# Patient Record
Sex: Female | Born: 1960 | Race: Black or African American | Hispanic: No | Marital: Single | Smoking: Never smoker
Health system: Southern US, Community
[De-identification: ages and names within clinical notes are randomized; demographics above are authoritative.]

## PROBLEM LIST (undated history)

## (undated) HISTORY — PX: COLONOSCOPY: SHX174

## (undated) HISTORY — PX: TONSILLECTOMY AND ADENOIDECTOMY: SUR1326

---

## 2013-06-08 HISTORY — PX: HEMORRHOID BANDING: SHX5850

## 2013-11-07 ENCOUNTER — Ambulatory Visit (INDEPENDENT_AMBULATORY_CARE_PROVIDER_SITE_OTHER): Payer: BC Managed Care – PPO | Admitting: Family Medicine

## 2013-11-07 ENCOUNTER — Encounter: Payer: Self-pay | Admitting: Family Medicine

## 2013-11-07 VITALS — BP 100/70 | HR 60 | Wt 160.0 lb

## 2013-11-07 DIAGNOSIS — K625 Hemorrhage of anus and rectum: Secondary | ICD-10-CM

## 2013-11-07 DIAGNOSIS — Z1239 Encounter for other screening for malignant neoplasm of breast: Secondary | ICD-10-CM

## 2013-11-07 LAB — COMPREHENSIVE METABOLIC PANEL
ALBUMIN: 3.9 g/dL (ref 3.5–5.2)
ALT: <8 U/L (ref 0–35)
AST: 14 U/L (ref 0–37)
Alkaline Phosphatase: 71 U/L (ref 39–117)
BUN: 8 mg/dL (ref 6–23)
CALCIUM: 9.5 mg/dL (ref 8.4–10.5)
CHLORIDE: 106 meq/L (ref 96–112)
CO2: 29 meq/L (ref 19–32)
Creat: 0.82 mg/dL (ref 0.50–1.10)
GLUCOSE: 81 mg/dL (ref 70–99)
POTASSIUM: 4.4 meq/L (ref 3.5–5.3)
SODIUM: 140 meq/L (ref 135–145)
TOTAL PROTEIN: 6.8 g/dL (ref 6.0–8.3)
Total Bilirubin: 0.4 mg/dL (ref 0.2–1.2)

## 2013-11-07 LAB — CBC WITH DIFFERENTIAL/PLATELET
Basophils Absolute: 0 10*3/uL (ref 0.0–0.1)
Basophils Relative: 1 % (ref 0–1)
Eosinophils Absolute: 0.2 10*3/uL (ref 0.0–0.7)
Eosinophils Relative: 4 % (ref 0–5)
HCT: 32.6 % — ABNORMAL LOW (ref 36.0–46.0)
HEMOGLOBIN: 11.1 g/dL — AB (ref 12.0–15.0)
LYMPHS ABS: 1.5 10*3/uL (ref 0.7–4.0)
Lymphocytes Relative: 35 % (ref 12–46)
MCH: 26.4 pg (ref 26.0–34.0)
MCHC: 34 g/dL (ref 30.0–36.0)
MCV: 77.4 fL — ABNORMAL LOW (ref 78.0–100.0)
MONOS PCT: 7 % (ref 3–12)
Monocytes Absolute: 0.3 10*3/uL (ref 0.1–1.0)
NEUTROS ABS: 2.2 10*3/uL (ref 1.7–7.7)
NEUTROS PCT: 53 % (ref 43–77)
Platelets: 268 10*3/uL (ref 150–400)
RBC: 4.21 MIL/uL (ref 3.87–5.11)
RDW: 14.5 % (ref 11.5–15.5)
WBC: 4.2 10*3/uL (ref 4.0–10.5)

## 2013-11-07 NOTE — Progress Notes (Signed)
   Subjective:    Patient ID: Kimberly Spencer, female    DOB: 1961-04-25, 53 y.o.   MRN: [id number]  HPI Approximately 5 days ago she noted dark red blood in her stool. The stool was quite hard. She notes he has had difficulty with hard stools especially since she's had some dental work and has not been able to normally. No nausea, vomiting, weight loss, abdominal pain. She has not seen a physician in the last several years.   Review of Systems     Objective:   Physical Exam Alert and in no distress. Cardiac exam shows regular rhythm without murmurs or gallops. Lungs are clear to auscultation. Abdominal exam shows decreased bowel sounds without masses or tenderness.       Assessment & Plan:  Rectal bleeding - Plan: CBC with Differential, Comprehensive metabolic panel, Ambulatory referral to Gastroenterology  Breast screening, unspecified - Plan: MM DIGITAL SCREENING BILATERAL  since she has not had a colonoscopy and is over age 80, I will refer her for colonoscopy. The one episode of dark red blood could be related to a hemorrhoid. I also discussed bowel habits with her in regard to fluids, bulk in diet and exercise to help keep her more regular. Recommend she return here for complete examination in the near future.

## 2013-11-09 ENCOUNTER — Encounter: Payer: Self-pay | Admitting: Internal Medicine

## 2014-01-10 ENCOUNTER — Encounter: Payer: Self-pay | Admitting: Internal Medicine

## 2014-01-10 ENCOUNTER — Ambulatory Visit (INDEPENDENT_AMBULATORY_CARE_PROVIDER_SITE_OTHER): Payer: BC Managed Care – PPO | Admitting: Internal Medicine

## 2014-01-10 VITALS — BP 130/80 | HR 60 | Ht 66.0 in | Wt 159.0 lb

## 2014-01-10 DIAGNOSIS — Z1211 Encounter for screening for malignant neoplasm of colon: Secondary | ICD-10-CM

## 2014-01-10 DIAGNOSIS — K649 Unspecified hemorrhoids: Secondary | ICD-10-CM

## 2014-01-10 NOTE — Patient Instructions (Signed)
You have been scheduled for a colonoscopy. Please follow written instructions given to you at your visit today.  Please pick up your prep supplies at the pharmacy. If you use inhalers (even only as needed), please bring them with you on the day of your procedure. Your physician has requested that you go to www.startemmi.com and enter the access code given to you at your visit today. This web site gives a general overview about your procedure. However, you should still follow specific instructions given to you by our office regarding your preparation for the procedure.   I appreciate the opportunity to care for you.  

## 2014-01-10 NOTE — Progress Notes (Signed)
[facility]  Kimberly Spencer    [id number]    [birth year]    Assessment and Plan/Recommendations:  Screening for Colon Cancer: Will schedule for screening colonoscopy Hemorrhoid with bleeding: Likely cause of pts blood in stool. Will assess further during colonoscopy.  HPI:  Kimberly MadeiraLinda Patricelli is a 53 y.o. AA female who had 1 episode of brick red stool several months ago presenting today with a desire for screening with colonoscopy.  She had no other symptoms at the time, and denies any inciting factors.  She states she had recent dental work and was taking regular Ibuprofen and feels as if she has had hemorrhoids in the past.  She states she does have "swollen rectal tissue" that she can appreciate with wiping and has visualized in a mirror previously.  She denies N/V, diarrhea, constipation, fever, chills, recent illness, hematochezia, or other episodes of melana. She has not noticed blood streaking on the toilet paper or tried any OTC hemorrhoid treatments. She states she has had a small amount of weight loss but has been dieting with more vegetables.  She denies family hx of colon cancer and has not had a colonoscopy.    Allergies as of 01/10/2014  . (No Known Allergies)    No past medical history on file.  Past Surgical History  Procedure Laterality Date  . Tonsillectomy and adenoidectomy      Family History  Problem Relation Age of Onset  . Lung cancer Maternal Uncle     History   Social History  . Marital Status: Unknown    Spouse Name: N/A    Number of Children: N/A  . Years of Education: N/A   Occupational History  . Not on file.   Social History Main Topics  . Smoking status: Never Smoker   . Smokeless tobacco: Never Used  . Alcohol Use: No  . Drug Use: No  . Sexual Activity: Not on file   Other Topics Concern  . Not on file   Social History Narrative  . No narrative on file      Review of systems: Positive for: One episode brick red  stool All other ROS negative or as per HPI  Physical Exam: BP 130/80  Pulse 60  Ht 5\' 6"  (1.676 m)  Wt 159 lb (72.122 kg)  BMI 25.68 kg/m2 Constitutional: WDWN NAD Eyes: anicteric Mouth: oral and posterior pharynx free of lesions Neck: supple, no mass or thyromegaly Lungs: clear to auscultation bilaterally Cardiovascular: S1S2 with regular rate and rhythm, no rubs murmurs or gallops Abdomen: soft, nontender, nondistended, no masses or organomegaly, normal bowel sounds. Hyperpigmented line in mid abdomen.  Pt states it appeared during pregnancy and never went away.  No surgical scars. Rectal: Anal tag consistent with previous hemorrhoids.  Hemorrhoidal tissue visible internally.  Performed by Dr. Leone PayorGessner with Patti SwazilandJordan, CMA as female chaperone. Extremities: no lower extremity edema  Skin: no rash Neuro: alert and oriented x 3 Psych: normal mood and affect  Data Reviewed: [facility]  Sarahi Borland A. [place], PA Student [employer] 01/10/2014 2:33 PM     I have personally seen the patient, reviewed and repeated key elements of the history and physical and participated in formation of the assessment and plan the student has documented.  Iva Booparl E. Gessner, MD, Clementeen GrahamFACG

## 2014-01-19 ENCOUNTER — Encounter: Payer: Self-pay | Admitting: Internal Medicine

## 2014-02-23 ENCOUNTER — Telehealth: Payer: Self-pay | Admitting: Internal Medicine

## 2014-02-23 NOTE — Telephone Encounter (Signed)
All questions answered about upcoming procedure

## 2014-02-27 ENCOUNTER — Encounter: Payer: Self-pay | Admitting: Internal Medicine

## 2014-02-27 ENCOUNTER — Ambulatory Visit (AMBULATORY_SURGERY_CENTER): Payer: BC Managed Care – PPO | Admitting: Internal Medicine

## 2014-02-27 VITALS — BP 157/90 | HR 47 | Temp 97.8°F | Resp 27 | Ht 66.0 in | Wt 159.0 lb

## 2014-02-27 DIAGNOSIS — Z1211 Encounter for screening for malignant neoplasm of colon: Secondary | ICD-10-CM

## 2014-02-27 DIAGNOSIS — K648 Other hemorrhoids: Secondary | ICD-10-CM | POA: Insufficient documentation

## 2014-02-27 MED ORDER — FLEET ENEMA 7-19 GM/118ML RE ENEM
1.0000 | ENEMA | Freq: Once | RECTAL | Status: AC
  Administered 2014-02-27: 1 via RECTAL

## 2014-02-27 MED ORDER — SODIUM CHLORIDE 0.9 % IV SOLN: 500.0000 mL | INTRAVENOUS | Status: DC

## 2014-02-27 NOTE — Progress Notes (Signed)
Report to PACU, RN, vss, BBS= Clear.  

## 2014-02-27 NOTE — Op Note (Signed)
[facility] [address] [place], [address]   COLONOSCOPY PROCEDURE REPORT  PATIENT: Kimberly Spencer, Kimberly Spencer  MR#: [id number] BIRTHDATE: 1960/07/01 , 53  yrs. old GENDER: female ENDOSCOPIST: Iva Boop, MD, [facility] PROCEDURE DATE:  02/27/2014 PROCEDURE:   Colonoscopy, screening First Screening Colonoscopy - Avg.  risk and is 50 yrs.  old or older Yes.  Prior Negative Screening - Now for repeat screening. N/A  History of Adenoma - Now for follow-up colonoscopy & has been > or = to 3 yrs.  N/A  Polyps Removed Today? No.  Recommend repeat exam, <10 yrs? Polyps Removed Today? No.  Recommend repeat exam, <10 yrs? No. ASA CLASS:   Class II INDICATIONS:first colonoscopy and average risk for colorectal cancer. MEDICATIONS: Monitored anesthesia care and Propofol 250 mg  DESCRIPTION OF PROCEDURE:   After the risks benefits and alternatives of the procedure were thoroughly explained, informed consent was obtained.  revealed hemorrhoids, Grade II and revealed no rectal mass.   The LB [id number]  endoscope was introduced through the anus and advanced to the cecum, which was identified by both the appendix and ileocecal valve. No adverse events experienced.   The quality of the prep was excellent, using MiraLax The instrument was then slowly withdrawn as the colon was fully examined.  COLON FINDINGS: The colonic mucosa appeared normal throughout the entire examined colon.   Right colon retroflexion included. Internal Grade II hemorrhoids were found.  Retroflexed views revealed internal Grade II hemorrhoids. The time to cecum=4 minutes 47 seconds.  Withdrawal time=9 minutes 14 seconds.  The scope was withdrawn and the procedure completed. COMPLICATIONS: There were no complications.  ENDOSCOPIC IMPRESSION: 1.   The colonic mucosa appeared normal throughout the entire examined colon 2.   Right colon retroflexion included 3.   Internal Grade II  hemorrhoids RECOMMENDATIONS: 1.  Repeat colonoscopy 10 years. 2.  My office will arrange hemorrhoid banding appointment.  eSigned:  Iva Boop, MD, [facility] 02/27/2014 4:07 PM   cc: Sharlot Gowda, MD and The Patient

## 2014-02-27 NOTE — Patient Instructions (Signed)

## 2014-02-27 NOTE — Progress Notes (Signed)
No problems noted in the recovery room. maw 

## 2014-02-27 NOTE — Progress Notes (Addendum)
Patient states that she did follow the directions, but her stool is still mushy soft.   Fleets enema given..results to follow.  Still sediment in stool.   One more fleets adm per patient; results to follow...  Fair results noted.

## 2014-02-28 ENCOUNTER — Telehealth: Payer: Self-pay

## 2014-02-28 ENCOUNTER — Telehealth: Payer: Self-pay | Admitting: *Deleted

## 2014-02-28 NOTE — Telephone Encounter (Signed)
Message copied by Annett Fabian on Wed Feb 28, 2014 10:14 AM ------      Message from: Iva Boop      Created: Tue Feb 27, 2014  3:59 PM      Regarding: needs banding appt       Please call her later in week and set up for the October banding session 10/16 ------

## 2014-02-28 NOTE — Telephone Encounter (Signed)
Left message for patient to call back  

## 2014-02-28 NOTE — Telephone Encounter (Signed)
  Follow up Call-  Call back number 02/27/2014  Post procedure Call Back phone  # [phone]  Permission to leave phone message Yes     Patient questions:  Do you have a fever, pain , or abdominal swelling? No. Pain Score  0 *  Have you tolerated food without any problems? Yes.    Have you been able to return to your normal activities? Yes.    Do you have any questions about your discharge instructions: Diet   No. Medications  No. Follow up visit  No.  Do you have questions or concerns about your Care? No.  Actions: * If pain score is 4 or above: No action needed, pain <4.

## 2014-03-01 NOTE — Telephone Encounter (Signed)
I spoke with the patient and she is scheduled for 03/23/14 3;15

## 2014-03-23 ENCOUNTER — Ambulatory Visit (INDEPENDENT_AMBULATORY_CARE_PROVIDER_SITE_OTHER): Payer: BC Managed Care – PPO | Admitting: Internal Medicine

## 2014-03-23 ENCOUNTER — Encounter: Payer: Self-pay | Admitting: Internal Medicine

## 2014-03-23 VITALS — BP 118/74 | HR 56 | Ht 66.0 in | Wt 158.4 lb

## 2014-03-23 DIAGNOSIS — K648 Other hemorrhoids: Secondary | ICD-10-CM

## 2014-03-23 NOTE — Progress Notes (Signed)
Patient ID: Kimberly MadeiraLinda Gambino, female   DOB: 09-13-1960, 53 y.o.   MRN: [id number]        The patient c/o mild constipation and some straining to stool. Occasional prolapse symptoms. Has had bleeding, itching and slight soiling in past. These are chronic and intermittent, las flare 1-2 months ago.   Rectal exam revealed normal anoderm and normal tone, no mass or rectocele. Good resting tone. Female staff present.  PROCEDURE NOTE: In the Left Lateral Decubitus position anoscopic examination revealed grade 1 hemorrhoids in the all position(s).  The anorectum was pre-medicated with 0.125% NTG  The patient presents with symptomatic grade 1  hemorrhoids, requesting rubber band ligation of his/her hemorrhoidal disease.  All risks, benefits and alternative forms of therapy were described and informed consent was obtained.   The decision was made to band the RA internal hemorrhoid, and the [facility] System was used to perform band ligation without complication.  Digital anorectal examination was then performed to assure proper positioning of the band, and to adjust the banded tissue as required.  The patient was discharged home without pain or other issues.  Dietary and behavioral recommendations were given and along with follow-up instructions.     The patient will return in 1 month for  follow-up and possible additional banding as required. No complications were encountered and the patient tolerated the procedure well.   I appreciate the opportunity to care for this patient.  NW:GNFAOZH,YQMVCc:LALONDE,JOHN Leonette MostHARLES, MD

## 2014-03-23 NOTE — Patient Instructions (Signed)
HEMORRHOID BANDING PROCEDURE    FOLLOW-UP CARE   1. The procedure you have had should have been relatively painless since the banding of the area involved does not have nerve endings and there is no pain sensation.  The rubber band cuts off the blood supply to the hemorrhoid and the band may fall off as soon as 48 hours after the banding (the band may occasionally be seen in the toilet bowl following a bowel movement). You may notice a temporary feeling of fullness in the rectum which should respond adequately to plain Tylenol or Motrin.  2. Following the banding, avoid strenuous exercise that evening and resume full activity the next day.  A sitz bath (soaking in a warm tub) or bidet is soothing, and can be useful for cleansing the area after bowel movements.     3. To avoid constipation, take two tablespoons of natural wheat bran, natural oat bran, flax, Benefiber or any over the counter fiber supplement and increase your water intake to 7-8 glasses daily.    4. Unless you have been prescribed anorectal medication, do not put anything inside your rectum for two weeks: No suppositories, enemas, fingers, etc.  5. Occasionally, you may have more bleeding than usual after the banding procedure.  This is often from the untreated hemorrhoids rather than the treated one.  Don't be concerned if there is a tablespoon or so of blood.  If there is more blood than this, lie flat with your bottom higher than your head and apply an ice pack to the area. If the bleeding does not stop within a half an hour or if you feel faint, call our office at [phone] or go to the emergency room.  6. Problems are not common; however, if there is a substantial amount of bleeding, severe pain, chills, fever or difficulty passing urine (very rare) or other problems, you should call us at [phone] or report to the nearest emergency room.  7. Do not stay seated continuously for more than 2-3 hours for a day or two  after the procedure.  Tighten your buttock muscles 10-15 times every two hours and take 10-15 deep breaths every 1-2 hours.  Do not spend more than a few minutes on the toilet if you cannot empty your bowel; instead re-visit the toilet at a later time.    We will see you at your next appointment November 20th at 2:15pm.   I appreciate the opportunity to care for you.

## 2014-03-23 NOTE — Assessment & Plan Note (Addendum)
RA banded - RTC 1 month 

## 2014-04-20 ENCOUNTER — Telehealth: Payer: Self-pay | Admitting: Internal Medicine

## 2014-04-20 NOTE — Telephone Encounter (Signed)
Ok with you Sir to switch her to the AM, that would give you 12 for the AM.

## 2014-04-23 NOTE — Telephone Encounter (Signed)
Sorry but that would be too many We could do it on another AM where we can fit her in

## 2014-04-23 NOTE — Telephone Encounter (Signed)
Spoke with patient and r/s'ed her for 05/25/14 at 9:15am.

## 2014-04-27 ENCOUNTER — Encounter: Payer: BC Managed Care – PPO | Admitting: Internal Medicine

## 2014-05-25 ENCOUNTER — Encounter: Payer: BC Managed Care – PPO | Admitting: Internal Medicine

## 2014-05-28 ENCOUNTER — Encounter: Payer: Self-pay | Admitting: Internal Medicine

## 2014-05-28 NOTE — Progress Notes (Signed)
Patient ID: Kimberly MadeiraLinda Afshar, female   DOB: Oct 23, 1960, 53 y.o.   MRN: [id number] The patient's chart has been reviewed by Dr.Gessner  and the recommendations are noted below.     Follow-up advised. Contact patient and offer to re- schedule visit .  Outcome of communication with the patielable appointment.nt:  Letter mailed

## 2014-07-02 ENCOUNTER — Encounter: Payer: Self-pay | Admitting: Internal Medicine

## 2014-07-02 ENCOUNTER — Ambulatory Visit (INDEPENDENT_AMBULATORY_CARE_PROVIDER_SITE_OTHER): Payer: 59 | Admitting: Internal Medicine

## 2014-07-02 VITALS — BP 122/74 | HR 68 | Ht 66.0 in | Wt 167.0 lb

## 2014-07-02 DIAGNOSIS — K641 Second degree hemorrhoids: Secondary | ICD-10-CM

## 2014-07-02 DIAGNOSIS — K648 Other hemorrhoids: Secondary | ICD-10-CM

## 2014-07-02 NOTE — Patient Instructions (Signed)
HEMORRHOID BANDING PROCEDURE    FOLLOW-UP CARE   1. The procedure you have had should have been relatively painless since the banding of the area involved does not have nerve endings and there is no pain sensation.  The rubber band cuts off the blood supply to the hemorrhoid and the band may fall off as soon as 48 hours after the banding (the band may occasionally be seen in the toilet bowl following a bowel movement). You may notice a temporary feeling of fullness in the rectum which should respond adequately to plain Tylenol or Motrin.  2. Following the banding, avoid strenuous exercise that evening and resume full activity the next day.  A sitz bath (soaking in a warm tub) or bidet is soothing, and can be useful for cleansing the area after bowel movements.     3. To avoid constipation, take two tablespoons of natural wheat bran, natural oat bran, flax, Benefiber or any over the counter fiber supplement and increase your water intake to 7-8 glasses daily.    4. Unless you have been prescribed anorectal medication, do not put anything inside your rectum for two weeks: No suppositories, enemas, fingers, etc.  5. Occasionally, you may have more bleeding than usual after the banding procedure.  This is often from the untreated hemorrhoids rather than the treated one.  Don't be concerned if there is a tablespoon or so of blood.  If there is more blood than this, lie flat with your bottom higher than your head and apply an ice pack to the area. If the bleeding does not stop within a half an hour or if you feel faint, call our office at [phone] or go to the emergency room.  6. Problems are not common; however, if there is a substantial amount of bleeding, severe pain, chills, fever or difficulty passing urine (very rare) or other problems, you should call us at [phone] or report to the nearest emergency room.  7. Do not stay seated continuously for more than 2-3 hours for a day or two  after the procedure.  Tighten your buttock muscles 10-15 times every two hours and take 10-15 deep breaths every 1-2 hours.  Do not spend more than a few minutes on the toilet if you cannot empty your bowel; instead re-visit the toilet at a later time.    Follow up with Dr Gessner as needed.   I appreciate the opportunity to care for you.  

## 2014-07-02 NOTE — Progress Notes (Signed)
Patient ID: Kimberly MadeiraLinda Spencer, female   DOB: 11-May-1961, 54 y.o.   MRN: [id number]        PROCEDURE NOTE: The patient presents with symptomatic grade 2  hemorrhoids, requesting rubber band ligation of his/her hemorrhoidal disease.  All risks, benefits and alternative forms of therapy were described and informed consent was obtained.   The anorectum was pre-medicated with 0.125% NTG The decision was made to band the RP and LL  internal hemorrhoids, and the [facility] System was used to perform band ligation without complication.  Digital anorectal examination was then performed to assure proper positioning of the band, and to adjust the banded tissue as required.  The patient was discharged home without pain or other issues.  Dietary and behavioral recommendations were given and along with follow-up instructions.      The patient will return prn  for  follow-up and possible additional banding as required. No complications were encountered and the patient tolerated the procedure well.  WU:[id number],NWGNCc:LALONDE,JOHN Leonette MostHARLES, MD

## 2014-07-04 NOTE — Assessment & Plan Note (Signed)
RP and LL banded RTC prn 

## 2017-02-10 ENCOUNTER — Ambulatory Visit (INDEPENDENT_AMBULATORY_CARE_PROVIDER_SITE_OTHER): Payer: BLUE CROSS/BLUE SHIELD | Admitting: Family Medicine

## 2017-02-10 VITALS — BP 122/70 | HR 58 | Resp 16 | Ht 67.0 in | Wt 188.0 lb

## 2017-02-10 DIAGNOSIS — Z23 Encounter for immunization: Secondary | ICD-10-CM

## 2017-02-10 DIAGNOSIS — Z1231 Encounter for screening mammogram for malignant neoplasm of breast: Secondary | ICD-10-CM | POA: Diagnosis not present

## 2017-02-10 DIAGNOSIS — J309 Allergic rhinitis, unspecified: Secondary | ICD-10-CM | POA: Diagnosis not present

## 2017-02-10 DIAGNOSIS — Z1239 Encounter for other screening for malignant neoplasm of breast: Secondary | ICD-10-CM

## 2017-02-10 NOTE — Progress Notes (Signed)
   Subjective:    Patient ID: Domingo MadeiraLinda Woolman, female    DOB: Nov 18, 1960, 56 y.o.   MRN: [id number]  HPI She complains of a one-day history of itchy watery eyes and rhinorrhea but no sneezing, sore throat, earache, cough, congestion, fever or chills. She has a remote history of allergies as a child but none since then.   Review of Systems     Objective:   Physical Exam Alert and in no distress. Tympanic membranes and canals are normal. Nasal mucosa is eddish purple. Pharyngeal area is normal. Neck is supple without adenopathy or thyromegaly. Cardiac exam shows a regular sinus rhythm without murmurs or gallops. Lungs are clear to auscultation.       Assessment & Plan:  Need for Tdap vaccination - Plan: Tdap vaccine greater than or equal to 7yo IM  Need for influenza vaccination - Plan: Flu Vaccine QUAD 6+ mos PF IM (Fluarix Quad PF)  Screening for breast cancer - Plan: MM DIGITAL SCREENING BILATERAL  Allergic rhinitis, unspecified seasonality, unspecified trigger  Although she's not had allergy symptoms in the past this does seem to be allergic in nature. Recommend an antihistamine for this. She will call if further difficulty. Her immunizations were also updated and I encouraged her to set up an appointment for complete exam.

## 2017-02-10 NOTE — Patient Instructions (Signed)
Use regular Claritin or Allegra for the sneezing and itchy watery eyes

## 2017-03-16 ENCOUNTER — Telehealth: Payer: Self-pay | Admitting: Family Medicine

## 2017-03-16 NOTE — Telephone Encounter (Signed)
Pt called checking on her mammogram. States the last time she was here she was suppose to get one sceduled she has not heard anything from it, she would like a call back, she can be reached at [phone]

## 2017-03-17 NOTE — Telephone Encounter (Signed)
Called and l/m for pt to let her know to call to [facility] to get this set up

## 2017-03-30 ENCOUNTER — Other Ambulatory Visit: Payer: Self-pay | Admitting: Family Medicine

## 2017-03-30 DIAGNOSIS — Z1231 Encounter for screening mammogram for malignant neoplasm of breast: Secondary | ICD-10-CM

## 2017-04-16 ENCOUNTER — Ambulatory Visit
Admission: RE | Admit: 2017-04-16 | Discharge: 2017-04-16 | Disposition: A | Discharge status: Home / Self Care | Payer: 59 | Source: Ambulatory Visit | Attending: Family Medicine | Admitting: Family Medicine

## 2017-05-06 ENCOUNTER — Ambulatory Visit: Payer: BLUE CROSS/BLUE SHIELD | Admitting: Family Medicine

## 2017-05-13 ENCOUNTER — Ambulatory Visit: Payer: BLUE CROSS/BLUE SHIELD | Admitting: Family Medicine

## 2017-05-13 ENCOUNTER — Telehealth: Payer: Self-pay | Admitting: Family Medicine

## 2017-05-13 ENCOUNTER — Encounter: Payer: Self-pay | Admitting: Family Medicine

## 2017-05-13 VITALS — BP 120/70 | HR 64 | Resp 16 | Wt 190.8 lb

## 2017-05-13 DIAGNOSIS — M79605 Pain in left leg: Secondary | ICD-10-CM
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
## 2018-03-28 ENCOUNTER — Other Ambulatory Visit: Payer: Self-pay | Admitting: Family Medicine

## 2018-03-28 DIAGNOSIS — Z1231 Encounter for screening mammogram for malignant neoplasm of breast: Secondary | ICD-10-CM

## 2018-05-03 ENCOUNTER — Other Ambulatory Visit: Payer: Self-pay | Admitting: Family Medicine

## 2018-05-03 ENCOUNTER — Ambulatory Visit
Admission: RE | Admit: 2018-05-03 | Discharge: 2018-05-03 | Disposition: A | Discharge status: Home / Self Care | Payer: BLUE CROSS/BLUE SHIELD | Source: Ambulatory Visit | Attending: Family Medicine | Admitting: Family Medicine

## 2018-05-03 DIAGNOSIS — Z1231 Encounter for screening mammogram for malignant neoplasm of breast: Secondary | ICD-10-CM

## 2018-05-16 ENCOUNTER — Other Ambulatory Visit (INDEPENDENT_AMBULATORY_CARE_PROVIDER_SITE_OTHER): Payer: BLUE CROSS/BLUE SHIELD

## 2018-05-16 DIAGNOSIS — Z23 Encounter for immunization: Secondary | ICD-10-CM | POA: Diagnosis not present

## 2019-05-14 LAB — HM MAMMOGRAPHY

## 2020-04-01 ENCOUNTER — Telehealth: Payer: Self-pay | Admitting: Family Medicine

## 2020-04-01 NOTE — Telephone Encounter (Signed)
Pt called and and wanted to know Dr. Jola Babinski thoughts on mixing covid vaccines. She had Laural Benes and Kennedy and would like to consider a different brand. Please advise at [phone].

## 2020-04-01 NOTE — Telephone Encounter (Signed)
Have her come in for a Mederna shot and I can take her son back and is a patient

## 2020-04-01 NOTE — Telephone Encounter (Signed)
Done and pt will call back to schedule his appointment just for shot.KH

## 2020-04-01 NOTE — Telephone Encounter (Signed)
Pt also inquired about her son stating he was a pt here as well, but pt has never established care here/

## 2020-04-02 ENCOUNTER — Ambulatory Visit (INDEPENDENT_AMBULATORY_CARE_PROVIDER_SITE_OTHER): Payer: 59

## 2020-04-02 ENCOUNTER — Other Ambulatory Visit: Payer: Self-pay

## 2020-04-02 DIAGNOSIS — Z23 Encounter for immunization: Secondary | ICD-10-CM

## 2020-09-25 ENCOUNTER — Other Ambulatory Visit: Payer: Self-pay

## 2020-09-25 ENCOUNTER — Ambulatory Visit (INDEPENDENT_AMBULATORY_CARE_PROVIDER_SITE_OTHER): Payer: 59

## 2020-09-25 DIAGNOSIS — Z23 Encounter for immunization: Secondary | ICD-10-CM | POA: Diagnosis not present

## 2021-02-24 ENCOUNTER — Other Ambulatory Visit: Payer: Self-pay

## 2021-02-24 ENCOUNTER — Other Ambulatory Visit (INDEPENDENT_AMBULATORY_CARE_PROVIDER_SITE_OTHER): Payer: 59

## 2021-02-24 DIAGNOSIS — Z23 Encounter for immunization: Secondary | ICD-10-CM

## 2021-03-20 ENCOUNTER — Other Ambulatory Visit: Payer: Self-pay

## 2021-03-20 ENCOUNTER — Ambulatory Visit (INDEPENDENT_AMBULATORY_CARE_PROVIDER_SITE_OTHER): Payer: 59

## 2021-03-20 DIAGNOSIS — Z23 Encounter for immunization: Secondary | ICD-10-CM

## 2021-07-21 ENCOUNTER — Other Ambulatory Visit: Payer: Self-pay

## 2021-07-21 ENCOUNTER — Ambulatory Visit (INDEPENDENT_AMBULATORY_CARE_PROVIDER_SITE_OTHER): Payer: Managed Care, Other (non HMO) | Admitting: Family Medicine

## 2021-07-21 VITALS — BP 124/80 | HR 63 | Temp 97.6°F | Resp 99 | Ht 67.0 in | Wt 205.4 lb

## 2021-07-21 DIAGNOSIS — R0781 Pleurodynia: Secondary | ICD-10-CM | POA: Diagnosis not present

## 2021-07-21 NOTE — Patient Instructions (Signed)
Take 2 Aleve twice per day for the next several weeks and see if that will take care of it

## 2021-07-21 NOTE — Progress Notes (Signed)
° °  Subjective:    Patient ID: Kimberly Spencer, female    DOB: [birth year], 61 y.o.   MRN: [id number]  HPI She complains of a several month history of left lateral rib pain.  No history of injury or overuse.  No fever, chills, cough or congestion.  Usually motion of the arm will cause the symptoms to recur.  Has not been bad enough that she is really tried much for it.   Review of Systems     Objective:   Physical Exam Alert and in no distress.  Lungs are clear to auscultation.  Slight left lateral rib tenderness in the mid rib area.  Compression test was negative.  Cardiac exam shows regular rhythm without murmurs or gallops.  No abdominal tenderness in the left upper quadrant.       Assessment & Plan:  Rib pain on left side - Plan: DG Chest 2 View Take 2 Aleve twice per day for the next several weeks and see if that will take care of it She will schedule a mammogram.

## 2021-07-22 ENCOUNTER — Ambulatory Visit
Admission: RE | Admit: 2021-07-22 | Discharge: 2021-07-22 | Disposition: A | Discharge status: Home / Self Care | Payer: Managed Care, Other (non HMO) | Source: Ambulatory Visit | Attending: Family Medicine | Admitting: Family Medicine

## 2021-09-15 ENCOUNTER — Ambulatory Visit (INDEPENDENT_AMBULATORY_CARE_PROVIDER_SITE_OTHER): Payer: Managed Care, Other (non HMO) | Admitting: Family Medicine

## 2021-09-15 ENCOUNTER — Encounter: Payer: Self-pay | Admitting: Family Medicine

## 2021-09-15 ENCOUNTER — Other Ambulatory Visit (HOSPITAL_COMMUNITY)
Admission: RE | Admit: 2021-09-15 | Discharge: 2021-09-15 | Disposition: A | Discharge status: Home / Self Care | Payer: Managed Care, Other (non HMO) | Source: Ambulatory Visit | Attending: Family Medicine | Admitting: Family Medicine

## 2021-09-15 VITALS — BP 130/80 | HR 61 | Temp 98.2°F | Ht 66.0 in | Wt 201.4 lb

## 2021-09-15 DIAGNOSIS — Z1159 Encounter for screening for other viral diseases: Secondary | ICD-10-CM

## 2021-09-15 DIAGNOSIS — Z0000 Encounter for general adult medical examination without abnormal findings: Secondary | ICD-10-CM | POA: Diagnosis not present

## 2021-09-15 DIAGNOSIS — J309 Allergic rhinitis, unspecified: Secondary | ICD-10-CM

## 2021-09-15 DIAGNOSIS — Z124 Encounter for screening for malignant neoplasm of cervix: Secondary | ICD-10-CM

## 2021-09-15 DIAGNOSIS — Z1231 Encounter for screening mammogram for malignant neoplasm of breast: Secondary | ICD-10-CM

## 2021-09-15 NOTE — Progress Notes (Signed)
? ?  Subjective:  ? ? Patient ID: Kimberly Spencer, female    DOB: 26-Feb-1961, 61 y.o.   MRN: [id number] ? ?HPI ?She is here for complete examination.  She does have underlying allergies and takes OTC meds for that.  Has a previous history of shingles but this was several years ago.  Otherwise she is doing quite nicely and is on no medications.  She is going with a church group to [place] and has followed up with the health department concerning need for shots and medications.  Otherwise she has no particular concerns or complaints.  Family and social history as well as health maintenance and immunizations was reviewed ? ? ?Review of Systems  ?All other systems reviewed and are negative. ? ?   ?Objective:  ? Physical Exam ?Alert and in no distress. Tympanic membranes and canals are normal. Pharyngeal area is normal. Neck is supple without adenopathy or thyromegaly. Cardiac exam shows a regular sinus rhythm without murmurs or gallops. Lungs are clear to auscultation.  Pelvic exam shows no masses or tenderness.  Pap smear taken. ? ? ? ? ?   ?Assessment & Plan:  ?Routine general medical examination at a health care facility - Plan: CBC with Differential/Platelet, Comprehensive metabolic panel, Lipid panel ? ?Need for hepatitis C screening test - Plan: Hepatitis C antibody ? ?Screening for cervical cancer - Plan: Cytology - PAP() ? 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 ? ?

## 2021-09-16 LAB — COMPREHENSIVE METABOLIC PANEL
ALT: 7 IU/L (ref 0–32)
AST: 20 IU/L (ref 0–40)
Albumin/Globulin Ratio: 1.4 (ref 1.2–2.2)
Albumin: 4.3 g/dL (ref 3.8–4.8)
Alkaline Phosphatase: 87 IU/L (ref 44–121)
BUN/Creatinine Ratio: 9 — ABNORMAL LOW (ref 12–28)
BUN: 10 mg/dL (ref 8–27)
Bilirubin Total: 0.3 mg/dL (ref 0.0–1.2)
CO2: 24 mmol/L (ref 20–29)
Calcium: 9.8 mg/dL (ref 8.7–10.3)
Chloride: 106 mmol/L (ref 96–106)
Creatinine, Ser: 1.09 mg/dL — ABNORMAL HIGH (ref 0.57–1.00)
Globulin, Total: 3 g/dL (ref 1.5–4.5)
Glucose: 83 mg/dL (ref 70–99)
Potassium: 4.4 mmol/L (ref 3.5–5.2)
Sodium: 143 mmol/L (ref 134–144)
Total Protein: 7.3 g/dL (ref 6.0–8.5)
eGFR: 57 mL/min/{1.73_m2} — ABNORMAL LOW (ref >59)

## 2021-09-16 LAB — CBC WITH DIFFERENTIAL/PLATELET
Basophils Absolute: 0 10*3/uL (ref 0.0–0.2)
Basos: 1 %
EOS (ABSOLUTE): 0.1 10*3/uL (ref 0.0–0.4)
Eos: 3 %
Hematocrit: 35.5 % (ref 34.0–46.6)
Hemoglobin: 11.6 g/dL (ref 11.1–15.9)
Immature Grans (Abs): 0 10*3/uL (ref 0.0–0.1)
Immature Granulocytes: 0 %
Lymphocytes Absolute: 2.1 10*3/uL (ref 0.7–3.1)
Lymphs: 41 %
MCH: 25.6 pg — ABNORMAL LOW (ref 26.6–33.0)
MCHC: 32.7 g/dL (ref 31.5–35.7)
MCV: 78 fL — ABNORMAL LOW (ref 79–97)
Monocytes Absolute: 0.3 10*3/uL (ref 0.1–0.9)
Monocytes: 7 %
Neutrophils Absolute: 2.5 10*3/uL (ref 1.4–7.0)
Neutrophils: 48 %
Platelets: 281 10*3/uL (ref 150–450)
RBC: 4.53 x10E6/uL (ref 3.77–5.28)
RDW: 13.5 % (ref 11.7–15.4)
WBC: 5.1 10*3/uL (ref 3.4–10.8)

## 2021-09-16 LAB — LIPID PANEL
Chol/HDL Ratio: 4.8 ratio — ABNORMAL HIGH (ref 0.0–4.4)
Cholesterol, Total: 190 mg/dL (ref 100–199)
HDL: 40 mg/dL (ref >39)
LDL Chol Calc (NIH): 126 mg/dL — ABNORMAL HIGH (ref 0–99)
Triglycerides: 134 mg/dL (ref 0–149)
VLDL Cholesterol Cal: 24 mg/dL (ref 5–40)

## 2021-09-16 LAB — HEPATITIS C ANTIBODY: Hep C Virus Ab: NONREACTIVE

## 2021-09-17 LAB — CYTOLOGY - PAP: Diagnosis: NEGATIVE

## 2021-10-20 ENCOUNTER — Ambulatory Visit
Admission: RE | Admit: 2021-10-20 | Discharge: 2021-10-20 | Disposition: A | Discharge status: Home / Self Care | Payer: Commercial Managed Care - HMO | Source: Ambulatory Visit | Attending: Family Medicine | Admitting: Family Medicine

## 2022-03-04 ENCOUNTER — Other Ambulatory Visit: Payer: Managed Care, Other (non HMO)

## 2022-03-05 ENCOUNTER — Other Ambulatory Visit (INDEPENDENT_AMBULATORY_CARE_PROVIDER_SITE_OTHER): Payer: Commercial Managed Care - HMO

## 2022-03-05 DIAGNOSIS — Z23 Encounter for immunization: Secondary | ICD-10-CM

## 2023-02-12 IMAGING — MG MM DIGITAL SCREENING BILAT W/ TOMO AND CAD
8 series · 8 of 24 positions shown · non-contrast
Comparison: Previous exam(s).

CLINICAL DATA: Screening.

EXAM:
DIGITAL SCREENING BILATERAL MAMMOGRAM WITH TOMOSYNTHESIS AND CAD
TECHNIQUE: Bilateral screening digital craniocaudal and mediolateral oblique
mammograms were obtained. Bilateral screening digital breast
tomosynthesis was performed. The images were evaluated with
computer-aided detection.

[L CC synth-2D]
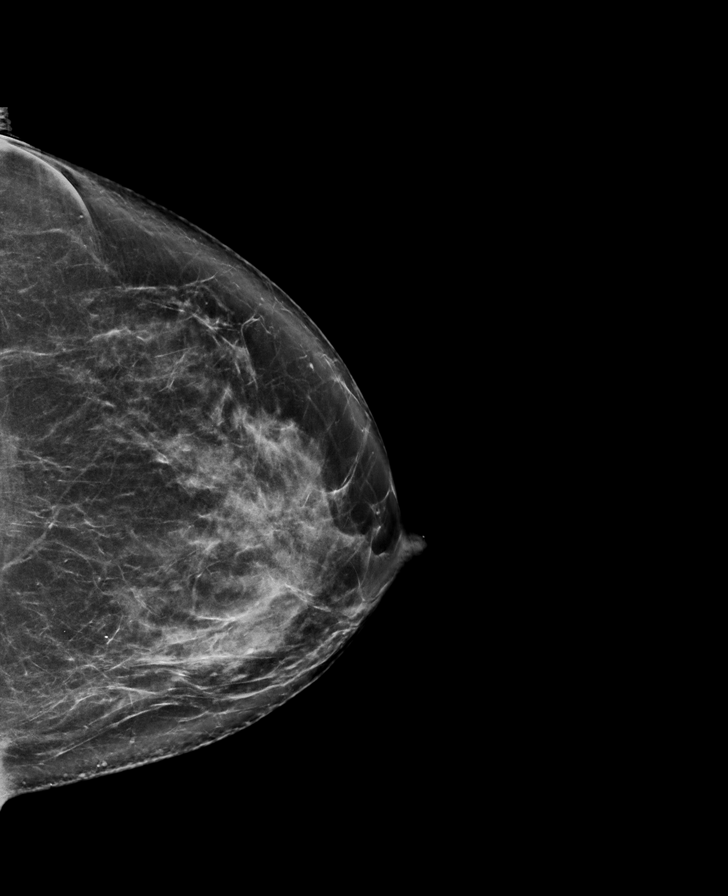

[R MLO synth-2D]
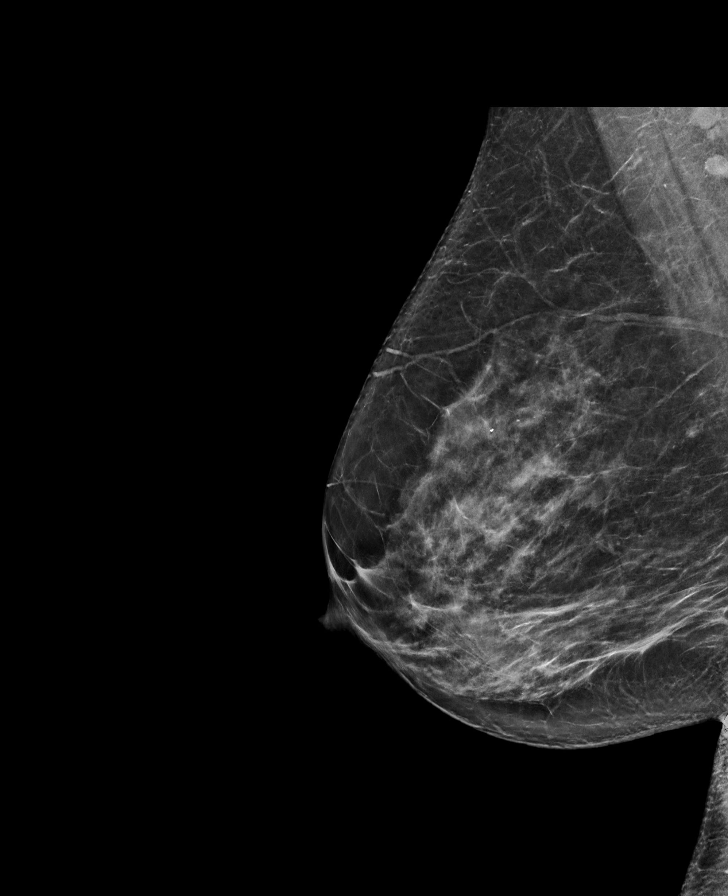

[R CC synth-2D]
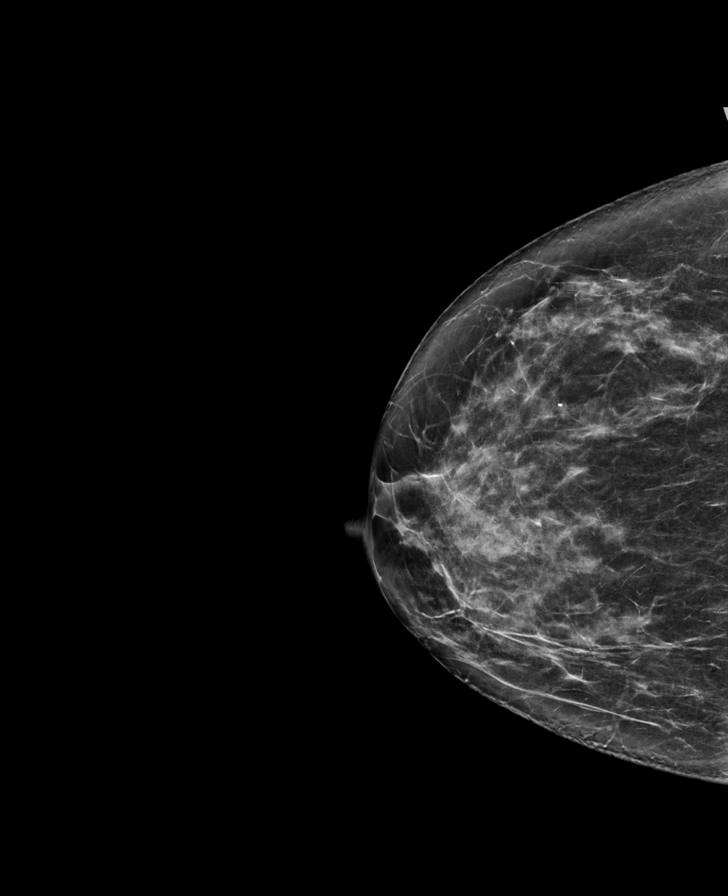

[L MLO synth-2D]
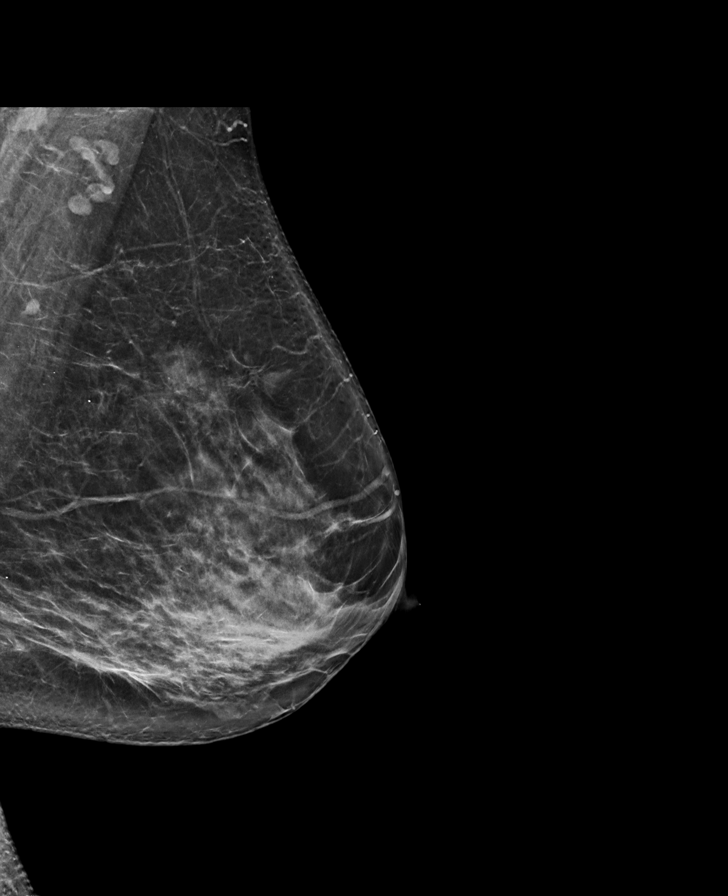

[L CC tomo · tomo slice 41/82.0]
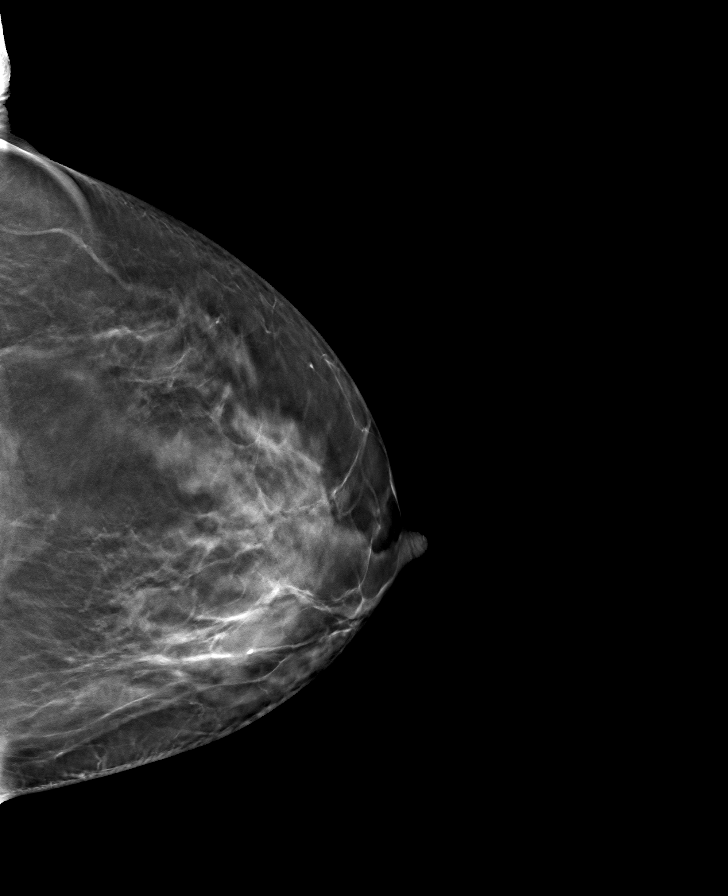

[R CC tomo · tomo slice 39/78.0]
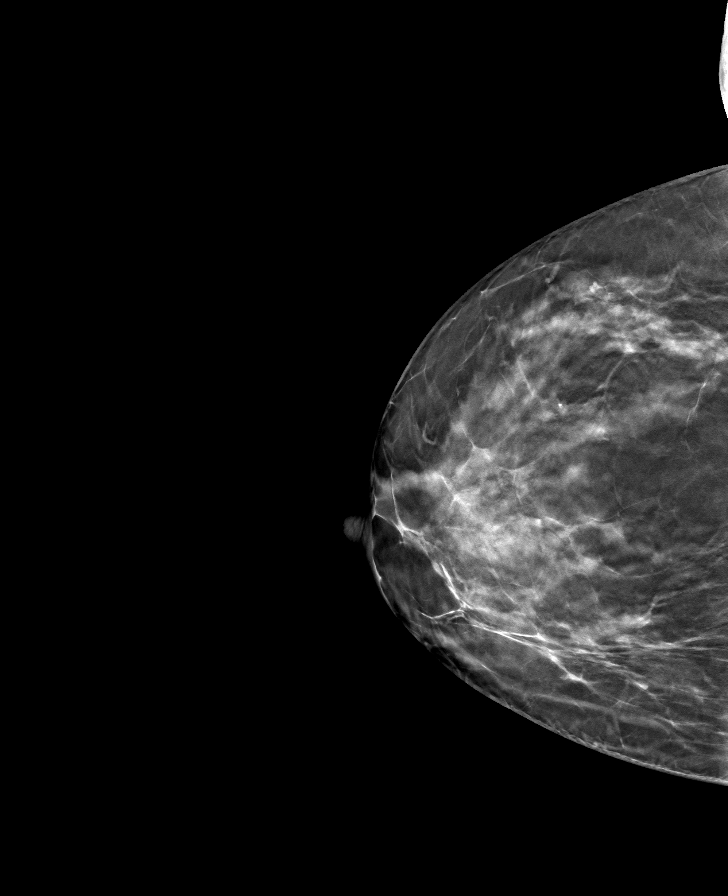

[L MLO tomo · tomo slice 39/78.0]
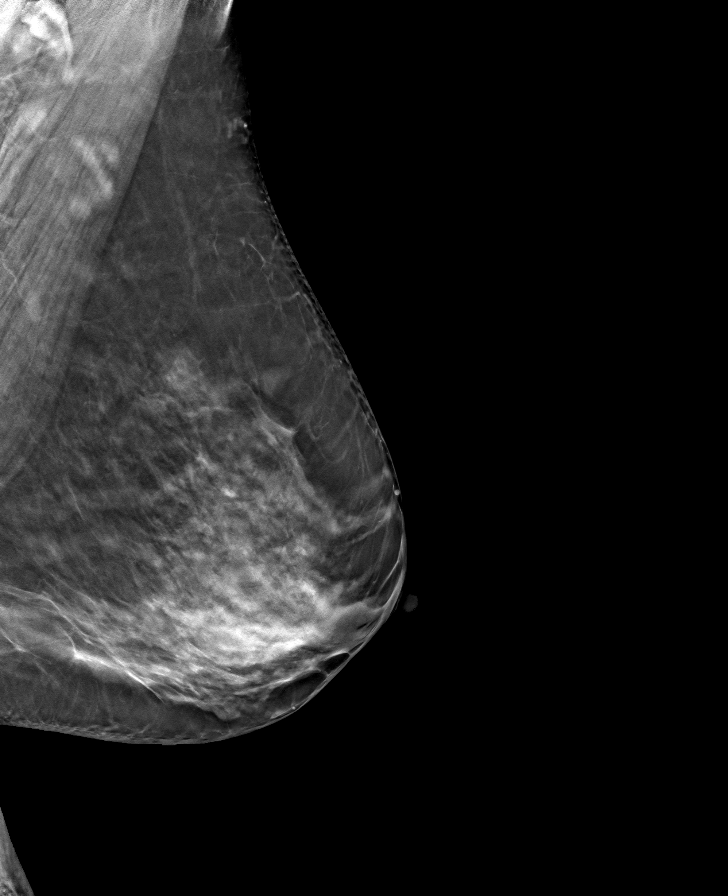

[R MLO tomo · tomo slice 39/78.0]
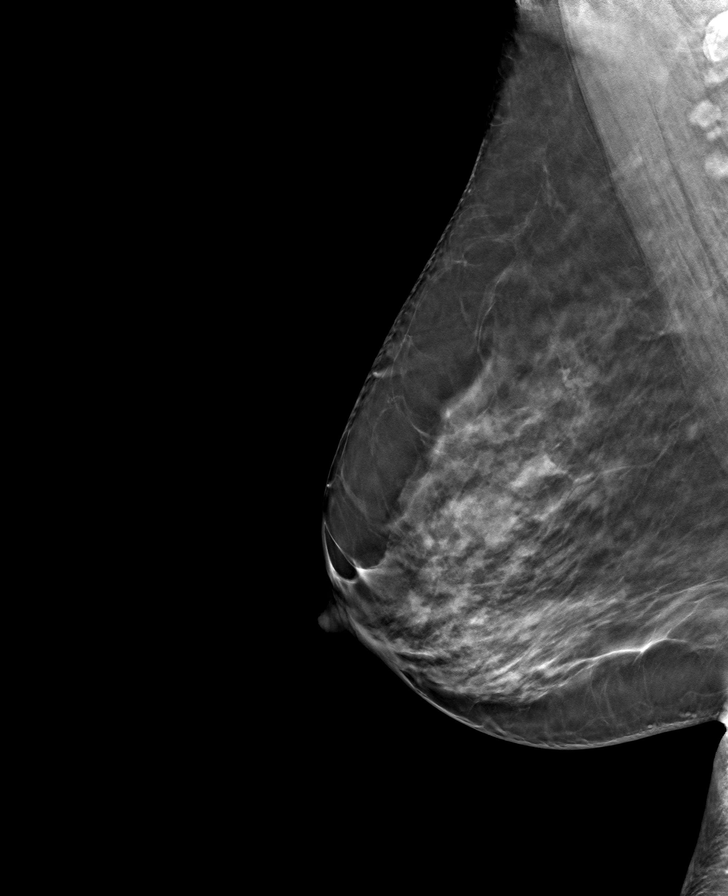

[8 of 24 positions shown; findings below may reference images not displayed]

ACR Breast Density Category c: The breast tissue is heterogeneously
dense, which may obscure small masses.
FINDINGS: There are no findings suspicious for malignancy.
IMPRESSION: No mammographic evidence of malignancy. A result letter of this
screening mammogram will be mailed directly to the patient.

RECOMMENDATION:
Screening mammogram in one year. (Code:Q3-W-BC3)

BI-RADS CATEGORY  1: Negative.

## 2023-04-22 ENCOUNTER — Other Ambulatory Visit: Payer: BLUE CROSS/BLUE SHIELD

## 2023-06-28 ENCOUNTER — Encounter: Payer: Self-pay | Admitting: Family Medicine

## 2023-06-28 ENCOUNTER — Ambulatory Visit (INDEPENDENT_AMBULATORY_CARE_PROVIDER_SITE_OTHER): Payer: No Typology Code available for payment source | Admitting: Family Medicine

## 2023-06-28 VITALS — BP 120/80 | HR 80 | Temp 103.4°F | Ht 67.0 in | Wt 199.4 lb

## 2023-06-28 DIAGNOSIS — R0989 Other specified symptoms and signs involving the circulatory and respiratory systems: Secondary | ICD-10-CM

## 2023-06-28 DIAGNOSIS — J101 Influenza due to other identified influenza virus with other respiratory manifestations: Secondary | ICD-10-CM | POA: Diagnosis not present

## 2023-06-28 DIAGNOSIS — R509 Fever, unspecified: Secondary | ICD-10-CM

## 2023-06-28 DIAGNOSIS — R058 Other specified cough: Secondary | ICD-10-CM

## 2023-06-28 DIAGNOSIS — R5383 Other fatigue: Secondary | ICD-10-CM

## 2023-06-28 LAB — POC COVID19 BINAXNOW: SARS Coronavirus 2 Ag: NEGATIVE

## 2023-06-28 LAB — POCT INFLUENZA A/B
Influenza A, POC: POSITIVE — AB
Influenza B, POC: NEGATIVE

## 2023-06-28 MED ORDER — OSELTAMIVIR PHOSPHATE 75 MG PO CAPS: 75.0000 mg | ORAL_CAPSULE | Freq: Two times a day (BID) | ORAL | 0 refills | Status: AC

## 2023-06-28 MED ORDER — BENZONATATE 200 MG PO CAPS: 200.0000 mg | ORAL_CAPSULE | Freq: Three times a day (TID) | ORAL | 0 refills | Status: AC | PRN

## 2023-06-28 NOTE — Patient Instructions (Signed)
Please drink plenty of water. Use tylenol and/or ibuprofen as needed for fever control or pain. Take the benzonatate if needed for cough. You may continue to use the Claritin D if needed for runny nose and congestion. You can also add in a plain Mucinex (guaifenesin) if needed for any thick drainage (nasal, postnasal, or any chest congestion).  Stay home until you are fever-free for 24 hours (and not due to taking fever-reducing medications) and your respiratory symptoms are improving. Wear a mask when you return to work until your respiratory symptoms are better.  You can get the COVID vaccine whenever you are better.

## 2023-06-28 NOTE — Progress Notes (Signed)
Chief Complaint  Patient presents with   Cough    Started around mid Dec 12/14 with congestion, cough, fever (low grade). Was better by the end of Dec. Neg covid tests. Symptoms started again after air travel last week, this Saturday. Presents today with runny nose, cough and fatigue. Neg covid test yesterday.    2 nights ago she started with a cough. Yesterday she felt like she had LG fever, and runny nose, PND and cough. Not really having much body aches, but +fatigue. Cough is nonproductive. Nasal drainage is clear. Denies sinus or ear pain.  Took a dose of Claritin D yesterday, helped some, but felt a little woozy.  She states she had a flu shot this year. Hasn't had COVID booster yet.  +recent airplane travel, crowds.   PMH, PSH, SH reviewed  Outpatient Encounter Medications as of 06/28/2023  Medication Sig Note   loratadine-pseudoephedrine (CLARITIN-D 12-HOUR) 5-120 MG tablet Take 1 tablet by mouth 2 (two) times daily. 06/28/2023: Last dose 3pm yesterday   No facility-administered encounter medications on file as of 06/28/2023.   No Known Allergies  ROS: URI symptoms per HPI No n/v/d, no rash. No chest pain, shortness of breath.   PHYSICAL EXAM:  BP 120/80   Pulse 80   Temp (!) 103.4 F (39.7 C) (Tympanic)   Ht 5\' 7"  (1.702 m)   Wt 199 lb 6.4 oz (90.4 kg)   BMI 31.23 kg/m   No coughing, appears comfortable, not ill-appearing, though eyes watering. No sniffling or coughing during visit. HEENT: conjunctiva and sclera are clear, EOMI. TM's and EAC's normal. Sinuses nontender. OP is clear Neck: some shotty, nontender anterior cervical LAD. No mass Heart: regular rate and rhythm Lungs: clear bilaterally Skin: normal turgor, no visible rash (limited exam) Neuro: alert and oriented, cranial nerves grossly intact, normal gait  +Influenza A, negative B COVID test negative   ASSESSMENT/PLAN:  Influenza A - Plan: oseltamivir (TAMIFLU) 75 MG capsule  Other cough -  Plan: Influenza A/B, POC COVID-19, benzonatate (TESSALON) 200 MG capsule  Fever, unspecified fever cause - Plan: Influenza A/B, POC COVID-19  Runny nose - Plan: Influenza A/B, POC COVID-19  Other fatigue - Plan: Influenza A/B, POC COVID-19  Please drink plenty of water. Use tylenol and/or ibuprofen as needed for fever control or pain. Take the benzonatate if needed for cough. You may continue to use the Claritin D if needed for runny nose and congestion. You can also add in a plain Mucinex (guaifenesin) if needed for any thick drainage (nasal, postnasal, or any chest congestion).  Stay home until you are fever-free for 24 hours (and not due to taking fever-reducing medications) and your respiratory symptoms are improving. Wear a mask when you return to work until your respiratory symptoms are better.  You can get the COVID vaccine whenever you are better.    Schedule CPE with [facility] (last was in 09/2021)

## 2023-07-26 ENCOUNTER — Ambulatory Visit (INDEPENDENT_AMBULATORY_CARE_PROVIDER_SITE_OTHER): Payer: No Typology Code available for payment source | Admitting: Family Medicine

## 2023-07-26 ENCOUNTER — Encounter: Payer: Self-pay | Admitting: Family Medicine

## 2023-07-26 VITALS — BP 126/82 | HR 68 | Ht 67.0 in | Wt 202.9 lb

## 2023-07-26 DIAGNOSIS — Z23 Encounter for immunization: Secondary | ICD-10-CM | POA: Diagnosis not present

## 2023-07-26 DIAGNOSIS — Z1231 Encounter for screening mammogram for malignant neoplasm of breast: Secondary | ICD-10-CM | POA: Diagnosis not present

## 2023-07-26 DIAGNOSIS — Z0000 Encounter for general adult medical examination without abnormal findings: Secondary | ICD-10-CM

## 2023-07-26 DIAGNOSIS — J309 Allergic rhinitis, unspecified: Secondary | ICD-10-CM | POA: Diagnosis not present

## 2023-07-26 DIAGNOSIS — Z1211 Encounter for screening for malignant neoplasm of colon: Secondary | ICD-10-CM

## 2023-07-26 NOTE — Progress Notes (Signed)
   Subjective:    Patient ID: Kimberly Spencer, female    DOB: December 23, 1960, 63 y.o.   MRN: [id number]  HPI She is here for complete examination.  She has enjoyed excellent health and has no concerns or questions.  She does have underlying allergies and it causes very little difficulty.  She continues to work.  She does not smoke or drink.  She has a remote history of shingles approximately 30 years ago.  Her last colonoscopy was over 10 years ago.   Review of Systems  All other systems reviewed and are negative. Family and social history as well as health maintenance and immunizations was reviewed.     Objective:    Physical Exam Alert and in no distress. Tympanic membranes and canals are normal. Pharyngeal area is normal. Neck is supple without adenopathy or thyromegaly. Cardiac exam shows a regular sinus rhythm without murmurs or gallops. Lungs are clear to auscultation.  Abdominal exam shows no masses or tenderness.        Assessment & Plan:  Routine general medical examination at a health care facility - Plan: CBC with Differential/Platelet, Comprehensive metabolic panel, Lipid panel  Screening for colon cancer - Plan: Cologuard  Encounter for screening mammogram for malignant neoplasm of breast - Plan: MM Digital Screening  Allergic rhinitis, unspecified seasonality, unspecified trigger  Need for shingles vaccine - Plan: Varicella-zoster vaccine IM  Need for COVID-19 vaccine - Plan: Pfizer Comirnaty Covid -19 Vaccine 48yrs and older I explained I thought was reasonable to get the shingles vaccine again.

## 2023-07-27 LAB — COMPREHENSIVE METABOLIC PANEL
ALT: 8 [IU]/L (ref 0–32)
AST: 15 [IU]/L (ref 0–40)
Albumin: 4.1 g/dL (ref 3.9–4.9)
Alkaline Phosphatase: 103 [IU]/L (ref 44–121)
BUN/Creatinine Ratio: 16 (ref 12–28)
BUN: 15 mg/dL (ref 8–27)
Bilirubin Total: <0.2 mg/dL (ref 0.0–1.2)
CO2: 22 mmol/L (ref 20–29)
Calcium: 9.5 mg/dL (ref 8.7–10.3)
Chloride: 107 mmol/L — ABNORMAL HIGH (ref 96–106)
Creatinine, Ser: 0.92 mg/dL (ref 0.57–1.00)
Globulin, Total: 2.6 g/dL (ref 1.5–4.5)
Glucose: 86 mg/dL (ref 70–99)
Potassium: 4 mmol/L (ref 3.5–5.2)
Sodium: 143 mmol/L (ref 134–144)
Total Protein: 6.7 g/dL (ref 6.0–8.5)
eGFR: 70 mL/min/{1.73_m2} (ref >59)

## 2023-07-27 LAB — CBC WITH DIFFERENTIAL/PLATELET
Basophils Absolute: 0 10*3/uL (ref 0.0–0.2)
Basos: 0 %
EOS (ABSOLUTE): 0.2 10*3/uL (ref 0.0–0.4)
Eos: 3 %
Hematocrit: 33.5 % — ABNORMAL LOW (ref 34.0–46.6)
Hemoglobin: 10.6 g/dL — ABNORMAL LOW (ref 11.1–15.9)
Immature Grans (Abs): 0 10*3/uL (ref 0.0–0.1)
Immature Granulocytes: 0 %
Lymphocytes Absolute: 1.8 10*3/uL (ref 0.7–3.1)
Lymphs: 33 %
MCH: 25.7 pg — ABNORMAL LOW (ref 26.6–33.0)
MCHC: 31.6 g/dL (ref 31.5–35.7)
MCV: 81 fL (ref 79–97)
Monocytes Absolute: 0.4 10*3/uL (ref 0.1–0.9)
Monocytes: 8 %
Neutrophils Absolute: 3 10*3/uL (ref 1.4–7.0)
Neutrophils: 56 %
Platelets: 261 10*3/uL (ref 150–450)
RBC: 4.13 x10E6/uL (ref 3.77–5.28)
RDW: 14.4 % (ref 11.7–15.4)
WBC: 5.4 10*3/uL (ref 3.4–10.8)

## 2023-07-27 LAB — LIPID PANEL
Chol/HDL Ratio: 4.7 {ratio} — ABNORMAL HIGH (ref 0.0–4.4)
Cholesterol, Total: 175 mg/dL (ref 100–199)
HDL: 37 mg/dL — ABNORMAL LOW (ref >39)
LDL Chol Calc (NIH): 108 mg/dL — ABNORMAL HIGH (ref 0–99)
Triglycerides: 173 mg/dL — ABNORMAL HIGH (ref 0–149)
VLDL Cholesterol Cal: 30 mg/dL (ref 5–40)

## 2023-08-29 LAB — COLOGUARD: COLOGUARD: NEGATIVE

## 2023-12-18 DIAGNOSIS — Z419 Encounter for procedure for purposes other than remedying health state, unspecified: Secondary | ICD-10-CM | POA: Diagnosis not present

## 2024-01-18 DIAGNOSIS — Z419 Encounter for procedure for purposes other than remedying health state, unspecified: Secondary | ICD-10-CM | POA: Diagnosis not present

## 2024-02-18 DIAGNOSIS — Z419 Encounter for procedure for purposes other than remedying health state, unspecified: Secondary | ICD-10-CM | POA: Diagnosis not present

## 2024-02-22 ENCOUNTER — Encounter: Payer: Self-pay | Admitting: Internal Medicine

## 2024-02-28 ENCOUNTER — Other Ambulatory Visit (INDEPENDENT_AMBULATORY_CARE_PROVIDER_SITE_OTHER)

## 2024-02-28 DIAGNOSIS — Z23 Encounter for immunization: Secondary | ICD-10-CM | POA: Diagnosis not present

## 2024-05-01 ENCOUNTER — Telehealth: Payer: Self-pay | Admitting: Family Medicine

## 2024-05-01 NOTE — Telephone Encounter (Signed)
 Copied from CRM [id number]. Topic: Clinical - Medical Advice >> May 01, 2024  9:33 AM Delon HERO wrote: Reason for CRM: Patient is calling to ask are there 2 parts to the RSV vaccine? And has she completed this? Please advise

## 2024-05-01 NOTE — Telephone Encounter (Signed)
Called Patient and LVM to call back

## 2024-05-15 ENCOUNTER — Ambulatory Visit

## 2024-05-16 ENCOUNTER — Other Ambulatory Visit: Payer: Self-pay

## 2024-05-16 ENCOUNTER — Telehealth: Payer: Self-pay

## 2024-05-16 DIAGNOSIS — Z1231 Encounter for screening mammogram for malignant neoplasm of breast: Secondary | ICD-10-CM

## 2024-05-16 DIAGNOSIS — Z1211 Encounter for screening for malignant neoplasm of colon: Secondary | ICD-10-CM

## 2024-05-16 NOTE — Telephone Encounter (Signed)
 Called Patient, ordered mammogram and colonoscopy.

## 2024-05-16 NOTE — Telephone Encounter (Signed)
 Copied from CRM [id number]. Topic: General - Other >> May 15, 2024  1:47 PM Joesph B wrote: Reason for CRM: Patient is calling because she was referred to [facility] for a mammogram. They don't accept her ins. She wants to know if her pcp can refer her to someone else who accepts her ins. She also received a letter from [employer] stating she is due for a colonoscopy and she wants to confirm if she's actually due and where can she go to have it done? Please fu with pt.   [facility] NEXT

## 2024-05-18 ENCOUNTER — Telehealth: Payer: Self-pay

## 2024-05-18 NOTE — Telephone Encounter (Signed)
 Due for recall colonoscopy with Dr. Avram. Spoke with patient available dates and times provided. Pt needs to check with her son before scheduling to ensure she has a care partner who can bring and stay with her.

## 2024-06-05 ENCOUNTER — Other Ambulatory Visit (INDEPENDENT_AMBULATORY_CARE_PROVIDER_SITE_OTHER)

## 2024-06-05 DIAGNOSIS — Z23 Encounter for immunization: Secondary | ICD-10-CM | POA: Diagnosis not present

## 2024-06-12 NOTE — Telephone Encounter (Signed)
 Called and spoke with patient-patient advised of need for colonoscopy to be scheduled and patient has requested that the office call her back on 06/15/2024 to schedule the colonoscopy;   Patient advised to call back to the office at [phone] should questions/concerns arise; Patient verbalized understanding of information/instructions;

## 2024-07-31 ENCOUNTER — Encounter: Payer: No Typology Code available for payment source | Admitting: Family Medicine

## 2024-08-28 ENCOUNTER — Encounter: Payer: Self-pay | Admitting: Family Medicine
